# Patient Record
Sex: Male | Born: 1975 | Race: White | Hispanic: No | Marital: Married | Smoking: Current every day smoker
Health system: Southern US, Community
[De-identification: ages and names within clinical notes are randomized; demographics above are authoritative.]

---

## 2003-11-25 ENCOUNTER — Emergency Department (HOSPITAL_COMMUNITY): Admission: EM | Admit: 2003-11-25 | Discharge: 2003-11-25 | Payer: Self-pay | Admitting: *Deleted

## 2010-07-19 ENCOUNTER — Emergency Department (HOSPITAL_COMMUNITY): Admission: EM | Admit: 2010-07-19 | Discharge: 2010-07-19 | Payer: Self-pay | Admitting: Emergency Medicine

## 2010-09-07 ENCOUNTER — Ambulatory Visit: Payer: Self-pay | Admitting: Diagnostic Radiology

## 2010-09-07 ENCOUNTER — Emergency Department (HOSPITAL_BASED_OUTPATIENT_CLINIC_OR_DEPARTMENT_OTHER): Admission: EM | Admit: 2010-09-07 | Discharge: 2010-09-07 | Payer: Self-pay | Admitting: Emergency Medicine

## 2011-02-09 ENCOUNTER — Inpatient Hospital Stay (INDEPENDENT_AMBULATORY_CARE_PROVIDER_SITE_OTHER)
Admission: RE | Admit: 2011-02-09 | Discharge: 2011-02-09 | Disposition: A | Discharge status: Home / Self Care | Payer: No Typology Code available for payment source | Source: Ambulatory Visit | Attending: Family Medicine | Admitting: Family Medicine

## 2011-02-09 DIAGNOSIS — J019 Acute sinusitis, unspecified: Secondary | ICD-10-CM

## 2011-09-05 IMAGING — CR DG CHEST 2V
2 series · 2 of 2 positions shown · non-contrast
Comparison: None

CLINICAL DATA: Hiccups.

CHEST - 2 VIEW

[w chest pa]
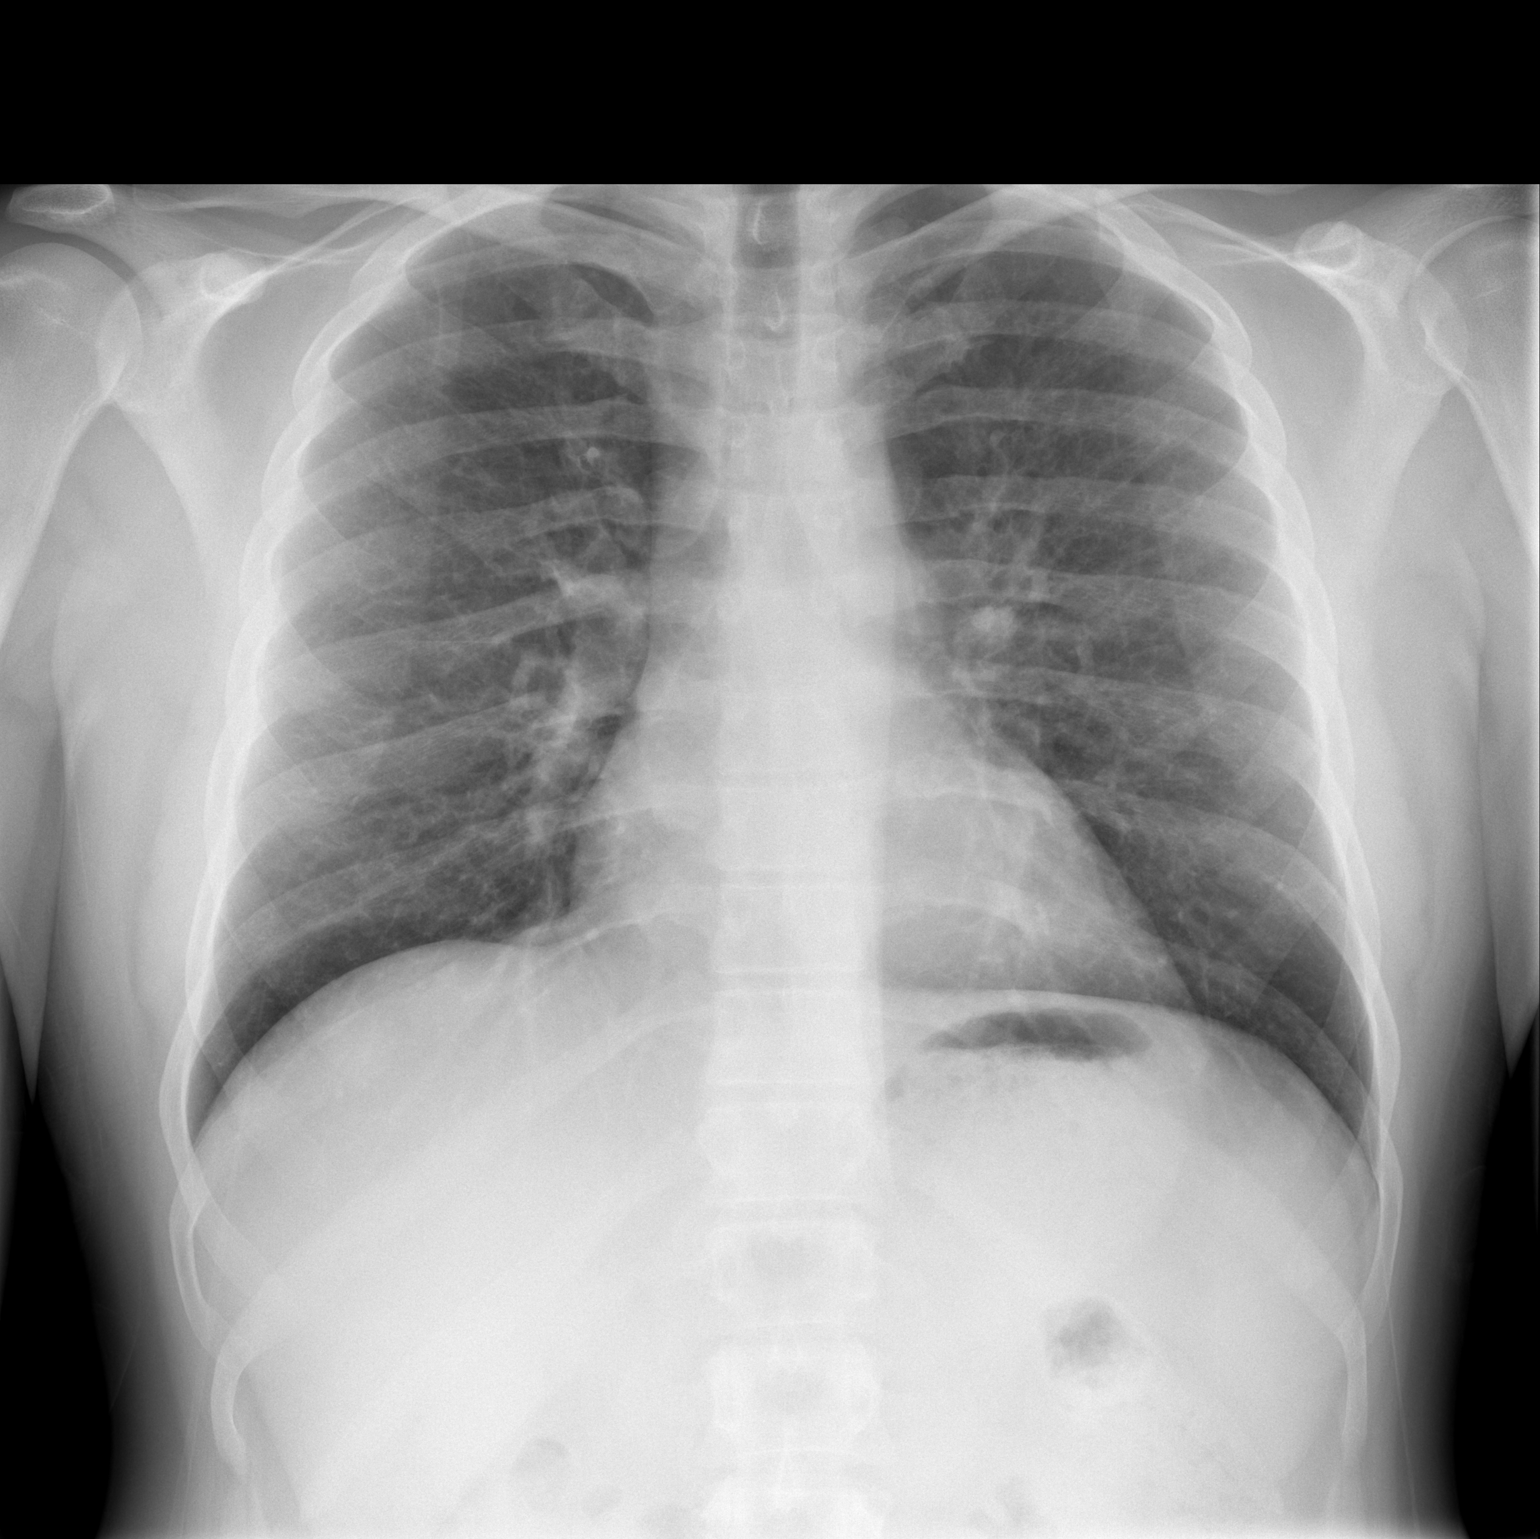

[w chest lat]
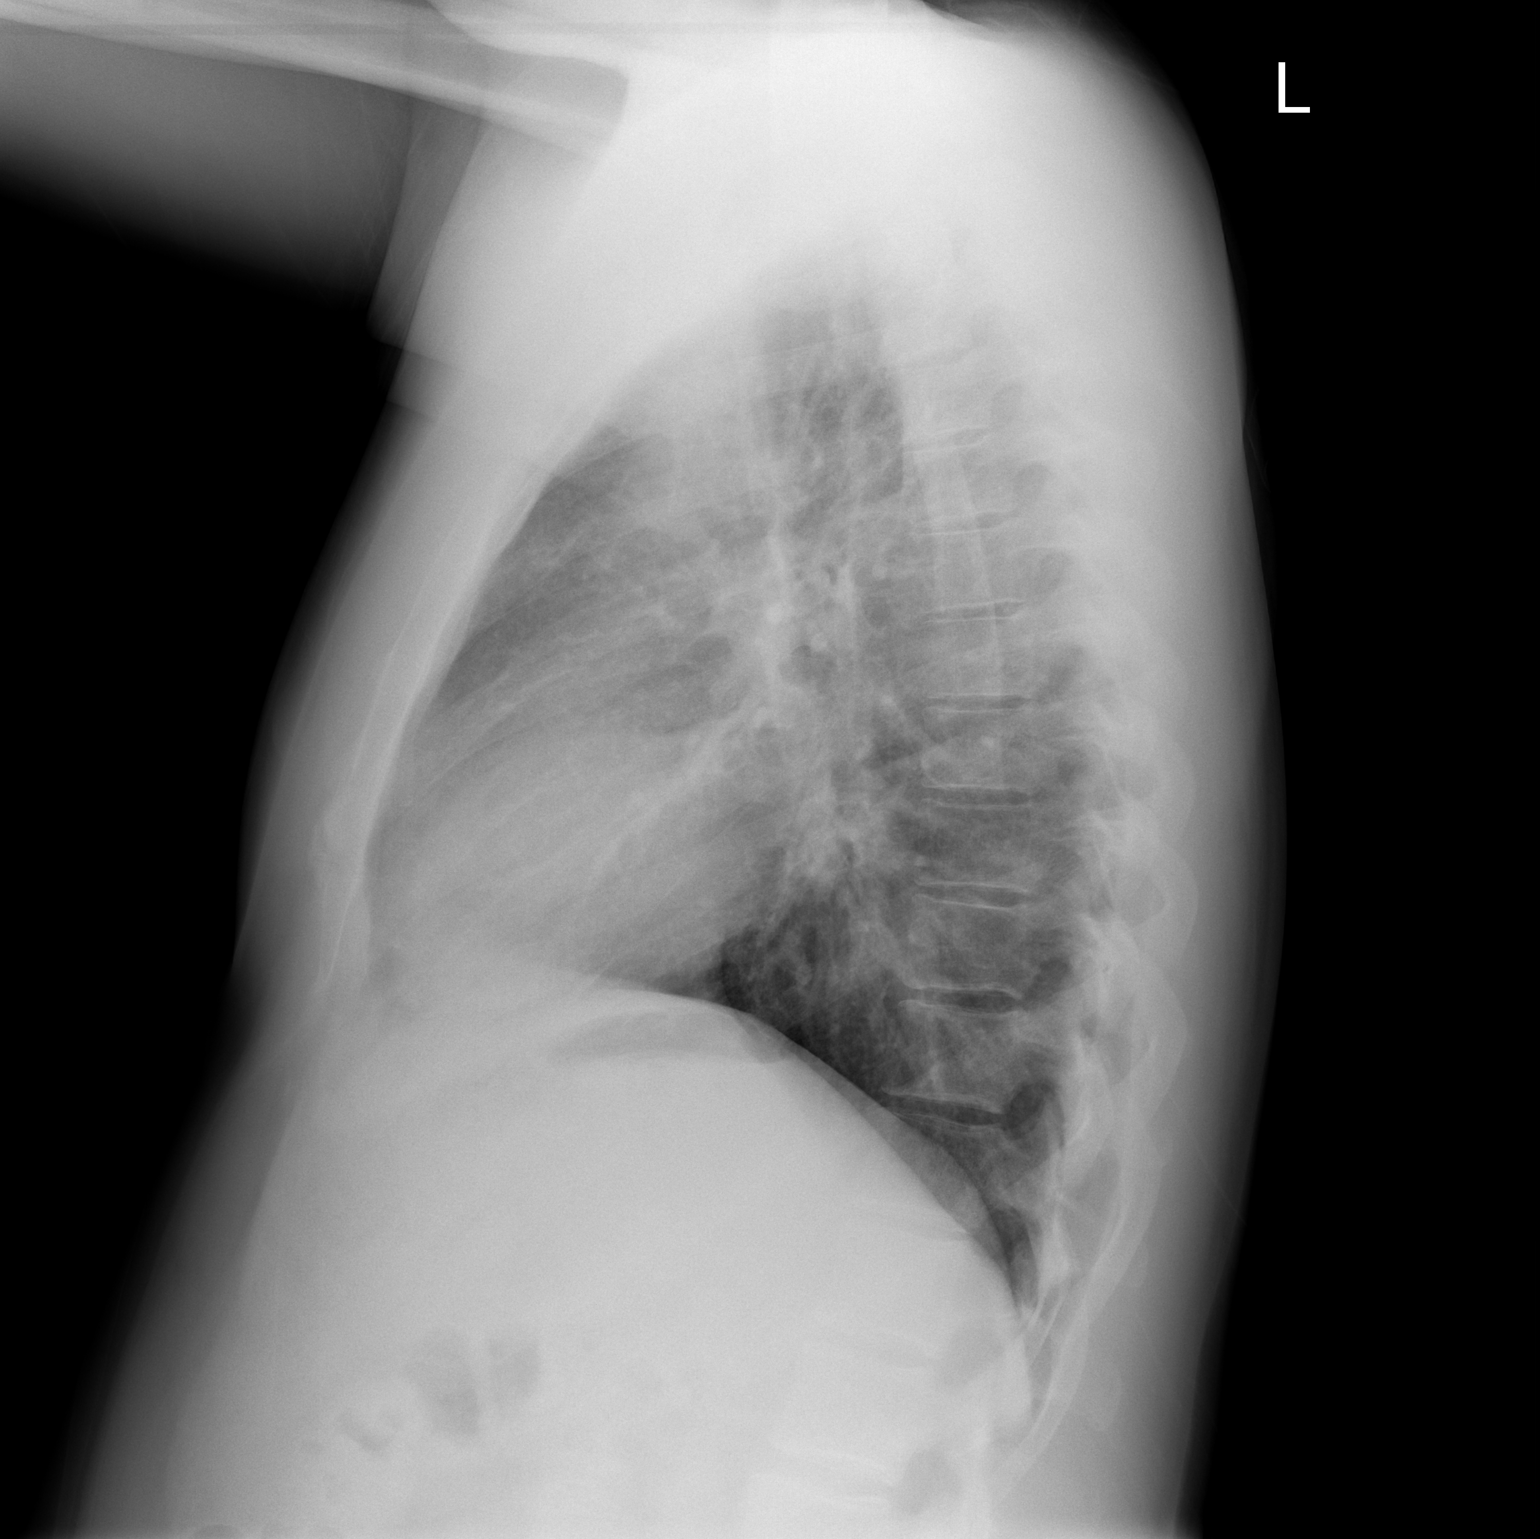

[2 of 2 positions shown; findings below may reference images not displayed]

FINDINGS: Heart size is normal and the vascularity is normal.
Lungs are clear without infiltrate or effusion.  Negative for mass
lesion.
IMPRESSION: Negative

## 2012-01-29 ENCOUNTER — Emergency Department (HOSPITAL_BASED_OUTPATIENT_CLINIC_OR_DEPARTMENT_OTHER)
Admission: EM | Admit: 2012-01-29 | Discharge: 2012-01-29 | Disposition: A | Discharge status: Home / Self Care | Payer: Medicaid Other | Attending: Emergency Medicine | Admitting: Emergency Medicine

## 2012-01-29 ENCOUNTER — Emergency Department (INDEPENDENT_AMBULATORY_CARE_PROVIDER_SITE_OTHER): Payer: Medicaid Other

## 2012-01-29 ENCOUNTER — Encounter (HOSPITAL_BASED_OUTPATIENT_CLINIC_OR_DEPARTMENT_OTHER): Payer: Self-pay | Admitting: *Deleted

## 2012-01-29 DIAGNOSIS — J069 Acute upper respiratory infection, unspecified: Secondary | ICD-10-CM

## 2012-01-29 DIAGNOSIS — F17200 Nicotine dependence, unspecified, uncomplicated: Secondary | ICD-10-CM | POA: Insufficient documentation

## 2012-01-29 DIAGNOSIS — J3489 Other specified disorders of nose and nasal sinuses: Secondary | ICD-10-CM | POA: Insufficient documentation

## 2012-01-29 DIAGNOSIS — R0981 Nasal congestion: Secondary | ICD-10-CM

## 2012-01-29 DIAGNOSIS — R059 Cough, unspecified: Secondary | ICD-10-CM | POA: Insufficient documentation

## 2012-01-29 DIAGNOSIS — R05 Cough: Secondary | ICD-10-CM

## 2012-01-29 MED ORDER — OXYMETAZOLINE HCL 0.05 % NA SOLN
1.0000 | Freq: Once | NASAL | Status: AC
  Administered 2012-01-29: 1 via NASAL
  Filled 2012-01-29: qty 15

## 2012-01-29 NOTE — ED Notes (Signed)
Pt c/o URI symptoms x 2 days 

## 2012-01-29 NOTE — Discharge Instructions (Signed)
Return to the ED with any concerns including difficulty breathing, vomiting and not able to keep down liquids, decreased level of alertness or lethargy, or any other alarming symptoms.  You should use the Afrin nasal spray twice daily for no more than 3 days maximum

## 2012-01-29 NOTE — ED Provider Notes (Signed)
History     CSN: [id number]  Arrival date & time 01/29/12  1550   First MD Initiated Contact with Patient 01/29/12 1621      Chief Complaint  Patient presents with  . URI    (Consider location/radiation/quality/duration/timing/severity/associated sxs/prior treatment) HPI Patient presents with complaint of nasal congestion and cough which began yesterday. He has had no fever. He does have some diffuse body aches for which he taken ibuprofen. He states his cough is productive of thick sputum. He denies any sinus pain or pressure. He's had no difficulty breathing or chest pain. He has had no difficulty swallowing liquids and no vomiting. He does endorsed smoking cigarettes. He tried NyQuil last night which did help his symptoms somewhat but then wore off. He's had no specific sick contacts. There no other associated systemic symptoms. There are no alleviating or modifying factors.  History reviewed. No pertinent past medical history.  History reviewed. No pertinent past surgical history.  History reviewed. No pertinent family history.  History  Substance Use Topics  . Smoking status: Current Everyday Smoker -- 0.5 packs/day  . Smokeless tobacco: Not on file  . Alcohol Use: No      Review of Systems ROS reviewed and otherwise negative except for mentioned in HPI  Allergies  Review of patient's allergies indicates no known allergies.  Home Medications   Current Outpatient Rx  Name Route Sig Dispense Refill  . DM-DOXYLAMINE-ACETAMINOPHEN 15-6.25-325 MG PO CAPS Oral Take 2 capsules by mouth at bedtime as needed. For congestion    . IBUPROFEN 200 MG PO TABS Oral Take 600 mg by mouth every 6 (six) hours as needed. For pain      BP 129/73  Pulse 79  Temp 97.5 F (36.4 C)  Resp 16  Ht 5\' 9"  (1.753 m)  Wt 203 lb (92.08 kg)  BMI 29.98 kg/m2  SpO2 100% Vitals reviewed Physical Exam Physical Examination: General appearance - alert, well appearing, and in no distress Mental  status - alert, oriented to person, place, and time Eyes - pupils equal and reactive, no conjunctival injection Ears - bilateral TM's and external ear canals normal Nose - normal and patent, mild erythema, no discharge or polyps Mouth - mucous membranes moist, pharynx normal without lesions Neck - supple, no significant adenopathy Chest - clear to auscultation, no wheezes, rales or rhonchi, symmetric air entry, no dyspnea or retractions Heart - normal rate, regular rhythm, normal S1, S2, no murmurs, rubs, clicks or gallops Abdomen - soft, nontender, nondistended, no masses or organomegaly Extremities - peripheral pulses normal, no pedal edema, no clubbing or cyanosis Skin - normal coloration and turgor, no rashes  ED Course  Procedures (including critical care time)  Labs Reviewed - No data to display Dg Chest 2 View  01/29/2012  *RADIOLOGY REPORT*  Clinical Data: Upper respiratory tract infection  CHEST - 2 VIEW  Comparison: 09/07/2010  Findings: Heart size appears normal.  No pleural effusion or edema.  No airspace consolidation identified.  Review of the visualized osseous structures is unremarkable.  IMPRESSION:  1.  No active cardiopulmonary abnormalities.  Original Report Authenticated By: Rosealee Albee, M.D.     1. Nasal congestion   2. Cough       MDM  Patient presents with nasal congestion and cough. His exam is reassuring, he is nontoxic and well-hydrated. His chest x-ray shows no acute abnormalities. This was discussed with him as well as the importance of smoking cessation. He was given Afrin nasal  spray to use twice daily for maximum of 3 days. He was given strict return precautions and is agreeable with this plan.         Ethelda Chick, MD 01/30/12 [phone]

## 2017-07-12 ENCOUNTER — Encounter (HOSPITAL_COMMUNITY): Payer: Self-pay | Admitting: *Deleted

## 2017-07-12 ENCOUNTER — Ambulatory Visit (HOSPITAL_COMMUNITY)
Admission: EM | Admit: 2017-07-12 | Discharge: 2017-07-12 | Disposition: A | Discharge status: Home / Self Care | Payer: BLUE CROSS/BLUE SHIELD | Attending: Internal Medicine | Admitting: Internal Medicine

## 2017-07-12 DIAGNOSIS — M7989 Other specified soft tissue disorders: Secondary | ICD-10-CM | POA: Diagnosis not present

## 2017-07-12 NOTE — Discharge Instructions (Signed)
Take ibuprofen as directed for the next 5-10 days to help with pain and inflammation. Ace wrap to help with swelling. Ice compress and elevation. Monitor for worsening of symptoms such as increased swelling, redness, warmth, fever, chest pain, shortness of breath, follow up at the emergency department for further evaluation. If swelling continues, follow up with PCP for further evaluation and venous study.

## 2017-07-12 NOTE — ED Triage Notes (Signed)
Pt  Reports   Some   Swelling  Of the  l  Foot  And  A   Small  Knot  On back of  Calf   For  sev  Weeks   Denies   Any   Injury    Poss  Insect  Bite  No  fver    Or   Weakness

## 2017-07-12 NOTE — ED Provider Notes (Signed)
CSN: [id number]     Arrival date & time 07/12/17  1432 History   None    Chief Complaint  Patient presents with  . Leg Pain   (Consider location/radiation/quality/duration/timing/severity/associated sxs/prior Treatment) 41 year old male comes in for a few day history of left lower leg swelling. States he also feels a knot right above his ankle. States painful to touch his left medial foot. Denies injury, bug bites. 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 He denies any chest pain, shortness of breath, palpitations.. Denies fever, chills, night sweats. Denies surrounding erythema, increased warmth. Has not taken anything for it. States he went to play golf couple of days ago, unsure if there might of been exposure that could've caused the swelling. No history of CAD, hypertensive medication, DVTs.      History reviewed. No pertinent past medical history. History reviewed. No pertinent surgical history. No family history on file. Social History  Substance Use Topics  . Smoking status: Current Every Day Smoker    Packs/day: 0.50  . Smokeless tobacco: Not on file  . Alcohol use No    Review of Systems  Reason unable to perform ROS: See HPI as above.    Allergies  Patient has no known allergies.  Home Medications   Prior to Admission medications   Medication Sig Start Date End Date Taking? Authorizing Provider  DM-Doxylamine-Acetaminophen (VICKS NYQUIL COLD & FLU) 15-6.25-325 MG CAPS Take 2 capsules by mouth at bedtime as needed. For congestion    [provider]  ibuprofen (ADVIL,MOTRIN) 200 MG tablet Take 600 mg by mouth every 6 (six) hours as needed. For pain    [provider]   Meds Ordered and Administered this Visit  Medications - No data to display  There were no vitals taken for this visit. No data found.   Physical Exam  Constitutional: He is oriented to person, place, and  time. He appears well-developed and well-nourished. No distress.  HENT:  Head: Normocephalic and atraumatic.  Cardiovascular: Normal rate, regular rhythm and normal heart sounds.  Exam reveals no gallop and no friction rub.   No murmur heard. Pulmonary/Chest: Effort normal and breath sounds normal. He has no decreased breath sounds. He has no wheezes. He has no rhonchi. He has no rales.  Musculoskeletal:  Mild swelling of the left lower extremity. No pitting edema. No surrounding erythema, increased warmth. Unable to appreciate left lower leg not patient described.  No tenderness on palpation of the knees and ankle bilaterally. Mild tenderness on palpation of the left medial foot, around first MCP. Full range of motion, strength normal and equal bilaterally. Sensation intact and equal bilaterally. Pedal pulses 2+ and equal.  Negative Homans sign.  Neurological: He is alert and oriented to person, place, and time.  Skin: Skin is warm and dry.  Psychiatric: He has a normal mood and affect. His behavior is normal. Judgment normal.    Urgent Care Course     Procedures (including critical care time)  Labs Review Labs Reviewed - No data to display  Imaging Review No results found.       MDM   1. Left leg swelling    Discussed with patient exam without alarming signs today. Given patient without tenderness to palpation of the calf, no palpable cords, low suspicion for DVT. Given without hypertensive medications, chest pain, shortness of breath, history of cardiac disease, low suspicion for medication side effects,  CHF, CAD causing symptoms. Given patient with mild tenderness to palpation of the left medial foot, will treat as a sprain. Patient to take ibuprofen as directed for pain. Ace wrap for compression. Ice compress and elevation. Patient to monitor for worsening of symptoms, increased pain/swelling, surrounding erythema, warmth, chest pain, shortness of breath, to go to the ED for  further evaluation.    Belinda FisherYu, Aanyah Loa V, PA-C 07/12/17 1534
